# Patient Record
Sex: Male | Born: 1965 | Race: White | Hispanic: No | Marital: Single | Smoking: Former smoker
Health system: Southern US, Community
[De-identification: ages and names within clinical notes are randomized; demographics above are authoritative.]

## PROBLEM LIST (undated history)

## (undated) DIAGNOSIS — K4090 Unilateral inguinal hernia, without obstruction or gangrene, not specified as recurrent: Secondary | ICD-10-CM

---

## 2013-01-01 ENCOUNTER — Emergency Department (HOSPITAL_COMMUNITY): Payer: Self-pay

## 2013-01-01 ENCOUNTER — Encounter (HOSPITAL_COMMUNITY): Payer: Self-pay | Admitting: Emergency Medicine

## 2013-01-01 ENCOUNTER — Emergency Department (HOSPITAL_COMMUNITY)
Admission: EM | Admit: 2013-01-01 | Discharge: 2013-01-01 | Disposition: A | Discharge status: Home / Self Care | Payer: Self-pay | Attending: Emergency Medicine | Admitting: Emergency Medicine

## 2013-01-01 DIAGNOSIS — K4090 Unilateral inguinal hernia, without obstruction or gangrene, not specified as recurrent: Secondary | ICD-10-CM | POA: Insufficient documentation

## 2013-01-01 DIAGNOSIS — R3129 Other microscopic hematuria: Secondary | ICD-10-CM

## 2013-01-01 DIAGNOSIS — R634 Abnormal weight loss: Secondary | ICD-10-CM | POA: Insufficient documentation

## 2013-01-01 DIAGNOSIS — R1013 Epigastric pain: Secondary | ICD-10-CM | POA: Insufficient documentation

## 2013-01-01 DIAGNOSIS — R109 Unspecified abdominal pain: Secondary | ICD-10-CM | POA: Diagnosis present

## 2013-01-01 DIAGNOSIS — R319 Hematuria, unspecified: Secondary | ICD-10-CM | POA: Insufficient documentation

## 2013-01-01 LAB — CBC WITH DIFFERENTIAL/PLATELET
Eosinophils Absolute: 0.1 10*3/uL (ref 0.0–0.7)
Eosinophils Relative: 1 % (ref 0–5)
Lymphs Abs: 1.8 10*3/uL (ref 0.7–4.0)
MCH: 31.2 pg (ref 26.0–34.0)
MCV: 89.3 fL (ref 78.0–100.0)
Monocytes Relative: 12 % (ref 3–12)
Platelets: 247 10*3/uL (ref 150–400)
RBC: 4.75 MIL/uL (ref 4.22–5.81)

## 2013-01-01 LAB — URINALYSIS, ROUTINE W REFLEX MICROSCOPIC
Nitrite: NEGATIVE
Specific Gravity, Urine: 1.026 (ref 1.005–1.030)
Urobilinogen, UA: 0.2 mg/dL (ref 0.0–1.0)

## 2013-01-01 LAB — COMPREHENSIVE METABOLIC PANEL
BUN: 10 mg/dL (ref 6–23)
Calcium: 10 mg/dL (ref 8.4–10.5)
GFR calc Af Amer: >90 mL/min (ref >90)
Glucose, Bld: 102 mg/dL — ABNORMAL HIGH (ref 70–99)
Sodium: 137 mEq/L (ref 135–145)
Total Protein: 7.9 g/dL (ref 6.0–8.3)

## 2013-01-01 LAB — URINE MICROSCOPIC-ADD ON

## 2013-01-01 LAB — LIPASE, BLOOD: Lipase: 33 U/L (ref 11–59)

## 2013-01-01 MED ORDER — IOHEXOL 300 MG/ML  SOLN
50.0000 mL | Freq: Once | INTRAMUSCULAR | Status: AC | PRN
  Administered 2013-01-01: 50 mL via ORAL

## 2013-01-01 MED ORDER — SODIUM CHLORIDE 0.9 % IV BOLUS (SEPSIS)
500.0000 mL | Freq: Once | INTRAVENOUS | Status: AC
  Administered 2013-01-01: 500 mL via INTRAVENOUS

## 2013-01-01 MED ORDER — FAMOTIDINE 20 MG PO TABS: 20.0000 mg | ORAL_TABLET | Freq: Two times a day (BID) | ORAL | Status: DC

## 2013-01-01 MED ORDER — IOHEXOL 300 MG/ML  SOLN
80.0000 mL | Freq: Once | INTRAMUSCULAR | Status: AC | PRN
  Administered 2013-01-01: 80 mL via INTRAVENOUS

## 2013-01-01 NOTE — ED Notes (Addendum)
Pt states that he has had lower abd pain with what he believes is a hernia in his groin.  Denies NVD.  Hernia is reducible.

## 2013-01-01 NOTE — ED Notes (Signed)
EDP at bedside speaking to pt and visitor at this time

## 2013-01-03 ENCOUNTER — Telehealth (HOSPITAL_COMMUNITY): Payer: Self-pay | Admitting: Emergency Medicine

## 2013-01-05 NOTE — ED Provider Notes (Addendum)
History     CSN: [id number]  Arrival date & time 01/01/13  1524   First MD Initiated Contact with Patient 01/01/13 1612      Chief Complaint  Patient presents with  . Abdominal Pain  . Inguinal Hernia    (Consider location/radiation/quality/duration/timing/severity/associated sxs/prior treatment) HPI..... epigastric abdominal pain for several days without vomiting, diarrhea, fever, chills. Patient also reports a chronic right inguinal hernia and 30 pound weight loss over last 2 months. No chronic medical problems. Nothing makes symptoms better or worse. Severity is mild to moderate. No radiation of pain.  History reviewed. No pertinent past medical history.  History reviewed. No pertinent past surgical history.  History reviewed. No pertinent family history.  History  Substance Use Topics  . Smoking status: Never Smoker   . Smokeless tobacco: Not on file  . Alcohol Use: No      Review of Systems  All other systems reviewed and are negative.    Allergies  Aspirin  Home Medications   Current Outpatient Rx  Name  Route  Sig  Dispense  Refill  . naproxen sodium (ANAPROX) 220 MG tablet   Oral   Take 220 mg by mouth as needed (pain).         . famotidine (PEPCID) 20 MG tablet   Oral   Take 1 tablet (20 mg total) by mouth 2 (two) times daily.   30 tablet   0     BP 133/83  Pulse 74  Temp(Src) 98.6 F (37 C) (Oral)  Resp 16  SpO2 99%  Physical Exam  Nursing note and vitals reviewed. Constitutional: He is oriented to person, place, and time. He appears well-developed and well-nourished.  HENT:  Head: Normocephalic and atraumatic.  Eyes: Conjunctivae and EOM are normal. Pupils are equal, round, and reactive to light.  Neck: Normal range of motion. Neck supple.  Cardiovascular: Normal rate, regular rhythm and normal heart sounds.   Pulmonary/Chest: Effort normal and breath sounds normal.  Abdominal:  Minimal epigastric tenderness.   Right inguinal  hernia, not incarcerated  Genitourinary:  No CVAT  Musculoskeletal: Normal range of motion.  Neurological: He is alert and oriented to person, place, and time.  Skin: Skin is warm and dry.  Psychiatric: He has a normal mood and affect.    ED Course  Procedures (including critical care time)  Labs Reviewed  CBC WITH DIFFERENTIAL - Abnormal; Notable for the following:    Monocytes Absolute 1.2 (*)    All other components within normal limits  COMPREHENSIVE METABOLIC PANEL - Abnormal; Notable for the following:    Glucose, Bld 102 (*)    All other components within normal limits  URINALYSIS, ROUTINE W REFLEX MICROSCOPIC - Abnormal; Notable for the following:    APPearance TURBID (*)    Hgb urine dipstick SMALL (*)    Leukocytes, UA TRACE (*)    All other components within normal limits  URINE MICROSCOPIC-ADD ON - Abnormal; Notable for the following:    Squamous Epithelial / LPF FEW (*)    Bacteria, UA FEW (*)    All other components within normal limits  LIPASE, BLOOD   No results found.   1. Abdominal pain   2. Hematuria   3. Weight loss       MDM  Uncertain etiology of patient's weight loss.  Screening blood work, CT abdomen pelvis, chest x-ray all negative.  Blood in urine noted to patient and his fiance.   Discharge meds Pepcid.  Primary care followup  Encouraged.   Patient will also followup on his hematuria.    Phone numbers given for urologist, general surgery, primary care.   This was all discussed with patient and his fiance in great detail        Donnetta Hutching, MD 01/05/13 1610  Donnetta Hutching, MD 01/15/13 [phone]

## 2013-01-15 DIAGNOSIS — R109 Unspecified abdominal pain: Secondary | ICD-10-CM | POA: Diagnosis present

## 2013-07-01 ENCOUNTER — Emergency Department (HOSPITAL_COMMUNITY)
Admission: EM | Admit: 2013-07-01 | Discharge: 2013-07-01 | Disposition: A | Discharge status: Home / Self Care | Payer: No Typology Code available for payment source | Attending: Emergency Medicine | Admitting: Emergency Medicine

## 2013-07-01 ENCOUNTER — Encounter (HOSPITAL_COMMUNITY): Payer: Self-pay | Admitting: Emergency Medicine

## 2013-07-01 DIAGNOSIS — K4090 Unilateral inguinal hernia, without obstruction or gangrene, not specified as recurrent: Secondary | ICD-10-CM

## 2013-07-01 DIAGNOSIS — N5089 Other specified disorders of the male genital organs: Secondary | ICD-10-CM | POA: Insufficient documentation

## 2013-07-01 DIAGNOSIS — R339 Retention of urine, unspecified: Secondary | ICD-10-CM | POA: Insufficient documentation

## 2013-07-01 HISTORY — DX: Unilateral inguinal hernia, without obstruction or gangrene, not specified as recurrent: K40.90

## 2013-07-01 MED ORDER — HYDROMORPHONE HCL PF 1 MG/ML IJ SOLN
1.0000 mg | Freq: Once | INTRAMUSCULAR | Status: AC
  Administered 2013-07-01: 1 mg via INTRAMUSCULAR
  Filled 2013-07-01: qty 1

## 2013-07-01 NOTE — ED Notes (Addendum)
Pt reports he has a R sided inguinal hernia since June of this year, that has moved down and now feels like a baseball sized mass to his testicle for the past 2 weeks. Pt states that he has had difficulty urinating since then as well.

## 2013-07-01 NOTE — ED Provider Notes (Signed)
CSN: [id number]     Arrival date & time 07/01/13  2038 History   First MD Initiated Contact with Patient 07/01/13 2122     Chief Complaint  Patient presents with  . Inguinal Hernia  . Urinary Retention   (Consider location/radiation/quality/duration/timing/severity/associated sxs/prior Treatment) HPI Comments: 47 yo male with a right sided inguinal mass.  Associated with constant, moderate, dull, aching pain.    Patient is a 47 y.o. male presenting with male genitourinary complaint.  Male GU Problem Presenting symptoms comment:  Scrotal swelling Context: spontaneously   Relieved by: laying flat. Exacerbated by: standing up. Ineffective treatments:  None tried Associated symptoms: abdominal pain (occasional, not now), groin pain and scrotal swelling   Associated symptoms: no fever     Past Medical History  Diagnosis Date  . Inguinal hernia    History reviewed. No pertinent past surgical history. History reviewed. No pertinent family history. History  Substance Use Topics  . Smoking status: Never Smoker   . Smokeless tobacco: Never Used  . Alcohol Use: No    Review of Systems  Constitutional: Negative for fever.  Gastrointestinal: Positive for abdominal pain (occasional, not now).  Genitourinary: Positive for scrotal swelling.  All other systems reviewed and are negative.    Allergies  Aspirin  Home Medications   Current Outpatient Rx  Name  Route  Sig  Dispense  Refill  . calcium carbonate (TUMS - DOSED IN MG ELEMENTAL CALCIUM) 500 MG chewable tablet   Oral   Chew 1 tablet by mouth daily as needed for indigestion or heartburn.         . naproxen sodium (ANAPROX) 220 MG tablet   Oral   Take 220 mg by mouth as needed (pain).         Marland Kitchen PE-DM-APAP & Doxylamin-DM-APAP (VICKS DAYQUIL/NYQUIL CLD & FLU) (LIQUID) MISC   Oral   Take 30 mLs by mouth at bedtime as needed (for sleep).          BP 145/93  Pulse 71  Temp(Src) 98.9 F (37.2 C) (Oral)  Resp 14   SpO2 97% Physical Exam  Nursing note and vitals reviewed. Constitutional: He is oriented to person, place, and time. He appears well-developed and well-nourished. No distress.  HENT:  Head: Normocephalic and atraumatic.  Eyes: Conjunctivae are normal. No scleral icterus.  Neck: Neck supple.  Cardiovascular: Normal rate and intact distal pulses.   Pulmonary/Chest: Effort normal. No stridor. No respiratory distress.  Abdominal: Normal appearance. He exhibits no distension. There is no tenderness. There is no rebound and no guarding. A hernia is present. Hernia confirmed positive in the right inguinal area.  Genitourinary: Right testis shows no tenderness. Left testis shows no tenderness.  Neurological: He is alert and oriented to person, place, and time.  Skin: Skin is warm and dry. No rash noted.  Psychiatric: He has a normal mood and affect. His behavior is normal.    ED Course  Hernia reduction Date/Time: 07/02/2013 12:37 AM Performed by: Blake Divine DAVID Authorized by: Blake Divine DAVID Consent: Verbal consent obtained. Risks and benefits: risks, benefits and alternatives were discussed Comments: Given 1mg  IM dilaudid.  Firm, constant, manual pressure was applied to his right inguinal mass which resulted in complete reduction of contents into abdomen.     (including critical care time) Labs Review Labs Reviewed - No data to display Imaging Review No results found.  EKG Interpretation   None       MDM   1. Right inguinal  hernia    47 yo male with a right sided inguinal hernia.  Has been getting worse over past 6 months.  After 1 mg IM dilaudid, I was able to fully reduce hernia.  Advised surgical followup.      Candyce Churn, MD 07/02/13 [phone]

## 2013-07-02 NOTE — Progress Notes (Signed)
   CARE MANAGEMENT ED NOTE 07/02/2013  Patient:  [name], [name]   Account Number:  [id number]  Date Initiated:  07/02/2013  Documentation initiated by:  Edd Arbour  Subjective/Objective Assessment:   47 yr old male listed with gccn discount (found to have [facility] charity program at 100% not orange card P4[facility]/GCCN) Pt referred by EDP to Dr Gordy Savers surgeon but called & informed that [facility] does not assist GCCN/charity pts Request another     Subjective/Objective Assessment Detail:   recommendation for a surgeon for hernia issues return number [phone]  dx with adult inguinal hernia  Carmen recommended pt to call [facility] surgeons/financial counselor to apply for [facility] charity program [phone]     Action/Plan:   Cm received pt's fiance' s voice message, reviewed EPIC notes labs, d/c instructions and avs Spoke with Dr Genene Churn, EDP who referred CM to Adult care center for possible gccn surgeon list Spoke with Porfirio Mylar at [phone] who assisted CM at 1651   Action/Plan Detail:   CM called back and spoke with fiance per pt permission and provided [facility] surgeon/financial counselor contact information provided by Porfirio Mylar to assist with seeing pt # [phone] Informed fiance pt is to speak with the charity staff   Anticipated DC Date:  07/01/2013     Status Recommendation to Physician:   Result of Recommendation:    Other ED Services  Consult Working Plan   In-house referral  Artist   DC Planning Services  Other  Outpatient Services - Pt will follow up    Choice offered to / List presented to:            Status of service:  Completed, signed off  ED Comments:   ED Comments Detail:

## 2013-07-03 ENCOUNTER — Telehealth (INDEPENDENT_AMBULATORY_CARE_PROVIDER_SITE_OTHER): Payer: Self-pay

## 2013-07-03 NOTE — Telephone Encounter (Signed)
Pt's girlfriend called back. I gave her the appt date with Dr Magnus Ivan and she said she called yesterday to our office to speak to someone about the orange card they have for insurance. They were told yesterday we do not participate with the orange card. I did check with the front desk about seeing this pt with the orange card again since there was no note from yesterday in the system and they advised me that we have met our quota for the year with the orange card. The pt would have to pay cash if he wants to be seen in this group. The pt's girlfriend told me to cancel the appt and she was checking with [facility] to see if they accept the orange card.

## 2013-07-03 NOTE — Telephone Encounter (Signed)
LMOM for pt to call me back. Dr Michaell Cowing left me a message in epic to get this pt scheduled with anyone in the group for a eval. Of [facility]. Dr Michaell Cowing received a call while he was on call about this pt having a hernia but Dr Michaell Cowing said the pt just needed an appt to be seen at [facility]. I have made the pt an appt with Dr Magnus Ivan for 07/11/13 arrive at 2:50/3:20.

## 2013-07-03 NOTE — Progress Notes (Signed)
07/03/13 1622 [facility] ED CM received a referral from [facility] ED Unit secretary stating a male called for the pt inquiring about a surgeon for the pt and requests a call back to [phone]  CM returned the call and spoke with the pt who gave the telephone to a male who inquired about other surgeons for pt CM informed male that the resources for [facility]  provided on 07/03/13 and on 07/01/13 Dr Karie Soda  are the only resources Cm could provide Pt still has not attempted to connect with a pcp

## 2013-07-11 ENCOUNTER — Ambulatory Visit (INDEPENDENT_AMBULATORY_CARE_PROVIDER_SITE_OTHER): Payer: Self-pay | Admitting: Surgery

## 2013-10-17 ENCOUNTER — Ambulatory Visit (INDEPENDENT_AMBULATORY_CARE_PROVIDER_SITE_OTHER): Payer: No Typology Code available for payment source | Admitting: Family Medicine

## 2013-10-17 ENCOUNTER — Encounter: Payer: Self-pay | Admitting: Family Medicine

## 2013-10-17 VITALS — BP 157/77 | HR 84 | Temp 98.1°F | Ht 71.0 in | Wt 189.3 lb

## 2013-10-17 DIAGNOSIS — K4090 Unilateral inguinal hernia, without obstruction or gangrene, not specified as recurrent: Secondary | ICD-10-CM

## 2013-10-17 DIAGNOSIS — Z720 Tobacco use: Secondary | ICD-10-CM

## 2013-10-17 DIAGNOSIS — F17200 Nicotine dependence, unspecified, uncomplicated: Secondary | ICD-10-CM

## 2013-10-17 NOTE — Progress Notes (Signed)
    Subjective:     Patient ID: Craig BougieGeorge Cantu, male   DOB: [birth year], 48 y.o.   MRN: [id number]  HPI [name] is here to est. Care and for f/u for his inguinal hernia.  His hernia started in June 2014.  This has been ongoing. He has had multiple ED visits for this problem. It has never been incarcerated and has been reducible.  The hernia has gotten much larger since its beginning and it starting to fill out his scrotum.  It has been causing pain due to this.  He denise any fevers, vomiting, or changes in bowel movements. Her has no changes on urination.  He has pain upon standing up straight.  The pain feels like it is tugging down his abdomen and feels electric.  The pain occurs a couple of times per week. The pain is relieved when he lies flat.  He had not trauma or strain upon the initiation of the pain.  He denies any warmth or erythema over his scrotum.   He is here to establish care. He has had increased stress since his hernia has started. He has been out of work and does not have insurance.  He started smoking again due to his stress.  He had previously quit smoking years ago.     Current Outpatient Prescriptions on File Prior to Visit  Medication Sig Dispense Refill  . calcium carbonate (TUMS - DOSED IN MG ELEMENTAL CALCIUM) 500 MG chewable tablet Chew 1 tablet by mouth daily as needed for indigestion or heartburn.      . naproxen sodium (ANAPROX) 220 MG tablet Take 220 mg by mouth as needed (pain).      Marland Kitchen. PE-DM-APAP & Doxylamin-DM-APAP (VICKS DAYQUIL/NYQUIL CLD & FLU) (LIQUID) MISC Take 30 mLs by mouth at bedtime as needed (for sleep).       No current facility-administered medications on file prior to visit.   SHx: started smoking again due to stress.   Health Maintenance:  Had a colonoscopy in 1997 because he had blood in his stool. It resulted to be normal.    Review of Systems See HPI     Objective:   Physical Exam BP 157/77  Pulse 84  Temp(Src) 98.1 F (36.7 C)  (Oral)  Ht 5\' 11"  (1.803 m)  Wt 189 lb 4.8 oz (85.866 kg)  BMI 26.41 kg/m2 Gen: NAD, alert, cooperative with exam, well-appearing, Caucasian male  HEENT: NCAT, clear conjunctiva,  CV: RRR, good S1/S2, no murmur, no edema, capillary refill brisk  Resp: CTABL, no wheezes, non-labored Abd: SNTND, BS present, no guarding or organomegaly GU: enlarged scrotum, no warm or erythema, attempted to reduce but too painful  Skin: no rashes, normal turgor  Neuro: no gross deficits.  Psych: good insight, alert and oriented     Assessment:         Plan:

## 2013-10-17 NOTE — Patient Instructions (Signed)
Thank you for coming in,   I will put in the referral to Surgery. Our office will call you when we have found someone to take the case.   You may use a hernia truss or a jock strap to help hold up the hernia. Holding up the hernia may reduce the amount of pain that you feel when you walk around. If you have any fevers, persistent vomiting or feel the hernia get really hard then you need to go to the ER immediately.   Please try to stop smoking. Smoking interferes with you healing once you have the surgery.  The patches cost about as much as a pack of cigarettes. At [employer] they cost $25.    Please feel free to call with any questions or concerns at any time, at [phone]. --Dr. Jordan Likes  Inguinal Hernia, Adult Muscles help keep everything in the body in its proper place. But if a weak spot in the muscles develops, something can poke through. That is called a hernia. When this happens in the lower part of the belly (abdomen), it is called an inguinal hernia. (It takes its name from a part of the body in this region called the inguinal canal.) A weak spot in the wall of muscles lets some fat or part of the small intestine bulge through. An inguinal hernia can develop at any age. Men get them more often than women. CAUSES  In adults, an inguinal hernia develops over time.  It can be triggered by:  Suddenly straining the muscles of the lower abdomen.  Lifting heavy objects.  Straining to have a bowel movement. Difficult bowel movements (constipation) can lead to this.  Constant coughing. This may be caused by smoking or lung disease.  Being overweight.  Being pregnant.  Working at a job that requires long periods of standing or heavy lifting.  Having had an inguinal hernia before. One type can be an emergency situation. It is called a strangulated inguinal hernia. It develops if part of the small intestine slips through the weak spot and cannot get back into the abdomen. The blood supply  can be cut off. If that happens, part of the intestine may die. This situation requires emergency surgery. SYMPTOMS  Often, a small inguinal hernia has no symptoms. It is found when a healthcare provider does a physical exam. Larger hernias usually have symptoms.   In adults, symptoms may include:  A lump in the groin. This is easier to see when the person is standing. It might disappear when lying down.  In men, a lump in the scrotum.  Pain or burning in the groin. This occurs especially when lifting, straining or coughing.  A dull ache or feeling of pressure in the groin.  Signs of a strangulated hernia can include:  A bulge in the groin that becomes very painful and tender to the touch.  A bulge that turns red or purple.  Fever, nausea and vomiting.  Inability to have a bowel movement or to pass gas. DIAGNOSIS  To decide if you have an inguinal hernia, a healthcare provider will probably do a physical examination.  This will include asking questions about any symptoms you have noticed.  The healthcare provider might feel the groin area and ask you to cough. If an inguinal hernia is felt, the healthcare provider may try to slide it back into the abdomen.  Usually no other tests are needed. TREATMENT  Treatments can vary. The size of the hernia makes a difference.  Options include:  Watchful waiting. This is often suggested if the hernia is small and you have had no symptoms.  No medical procedure will be done unless symptoms develop.  You will need to watch closely for symptoms. If any occur, contact your healthcare provider right away.  Surgery. This is used if the hernia is larger or you have symptoms.  Open surgery. This is usually an outpatient procedure (you will not stay overnight in a hospital). An cut (incision) is made through the skin in the groin. The hernia is put back inside the abdomen. The weak area in the muscles is then repaired by herniorrhaphy or  hernioplasty. Herniorrhaphy: in this type of surgery, the weak muscles are sewn back together. Hernioplasty: a patch or mesh is used to close the weak area in the abdominal wall.  Laparoscopy. In this procedure, a surgeon makes small incisions. A thin tube with a tiny video camera (called a laparoscope) is put into the abdomen. The surgeon repairs the hernia with mesh by looking with the video camera and using two long instruments. HOME CARE INSTRUCTIONS   After surgery to repair an inguinal hernia:  You will need to take pain medicine prescribed by your healthcare provider. Follow all directions carefully.  You will need to take care of the wound from the incision.  Your activity will be restricted for awhile. This will probably include no heavy lifting for several weeks. You also should not do anything too active for a few weeks. When you can return to work will depend on the type of job that you have.  During "watchful waiting" periods, you should:  Maintain a healthy weight.  Eat a diet high in fiber (fruits, vegetables and whole grains).  Drink plenty of fluids to avoid constipation. This means drinking enough water and other liquids to keep your urine clear or pale yellow.  Do not lift heavy objects.  Do not stand for long periods of time.  Quit smoking. This should keep you from developing a frequent cough. SEEK MEDICAL CARE IF:   A bulge develops in your groin area.  You feel pain, a burning sensation or pressure in the groin. This might be worse if you are lifting or straining.  You develop a fever of more than 100.5 F (38.1 C). SEEK IMMEDIATE MEDICAL CARE IF:   Pain in the groin increases suddenly.  A bulge in the groin gets bigger suddenly and does not go down.  For men, there is sudden pain in the scrotum. Or, the size of the scrotum increases.  A bulge in the groin area becomes red or purple and is painful to touch.  You have nausea or vomiting that does not  go away.  You feel your heart beating much faster than normal.  You cannot have a bowel movement or pass gas.  You develop a fever of more than 102.0 F (38.9 C). Document Released: 12/04/2008 Document Revised: 10/10/2011 Document Reviewed: 12/04/2008 [facility] Patient Information 2014 [place], [place].

## 2013-10-21 ENCOUNTER — Encounter: Payer: Self-pay | Admitting: Family Medicine

## 2013-10-21 DIAGNOSIS — Z720 Tobacco use: Secondary | ICD-10-CM | POA: Insufficient documentation

## 2013-10-21 DIAGNOSIS — K4090 Unilateral inguinal hernia, without obstruction or gangrene, not specified as recurrent: Secondary | ICD-10-CM | POA: Insufficient documentation

## 2013-10-21 NOTE — Assessment & Plan Note (Addendum)
Hernia is not incarcerated. Not able to be reproducible due to pain. [place]  If no surgeon takes his case [facility]. - advised to use either a jock strap for support or truss support  - Referral to general surgery  - discussed with Dr. Deirdre Priesthambliss.

## 2013-10-21 NOTE — Assessment & Plan Note (Signed)
Started smoking again. He states he has quit before with patches. Advised that a box of patches is the same price of carton of cigarettes. He was receptive. It he were to have the surgery it would decrease his wound healing if continue to smoke  - [facility] has box of patches for $25.  - He appears receptive to quitting and has quit before for several years.

## 2015-01-10 IMAGING — CT CT ABD-PELV W/ CM
1 of 3 series · 14 of 32 positions shown, 19 images · IV contrast (OMNIPAQUE 300)
Comparison: None.

CLINICAL DATA: Right inguinal hernia.  Epigastric pain.  40 pounds
weight loss.

CT ABDOMEN AND PELVIS WITH CONTRAST
TECHNIQUE: Multidetector CT imaging of the abdomen and pelvis was
performed following the standard protocol during bolus
administration of intravenous contrast.
Contrast: 50mL OMNIPAQUE IOHEXOL 300 MG/ML  SOLN, 80mL OMNIPAQUE
IOHEXOL 300 MG/ML  SOLN

[Series 2: abd/pel with · axial · 0.79mm/px · z∈[+992,+1382]mm · 14 of 90 slices shown, 19 images]
[im 6/90  soft-tissue]
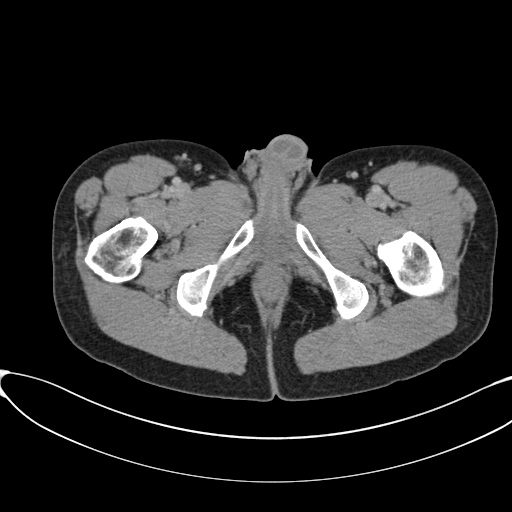
[im 6/90  bone]
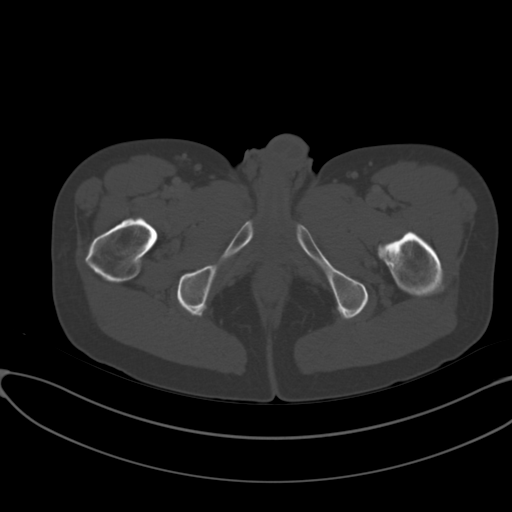
[im 11/90  soft-tissue]
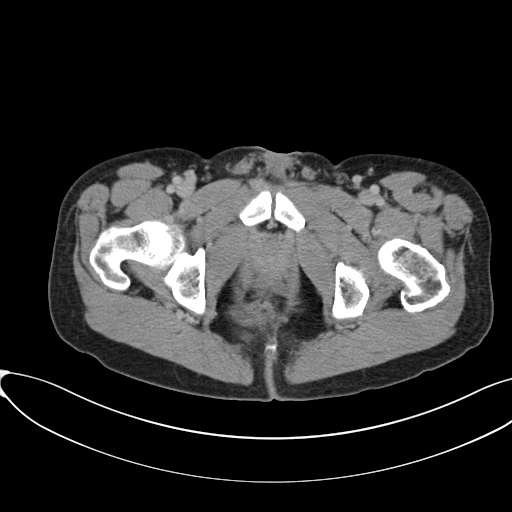
[im 21/90  soft-tissue]
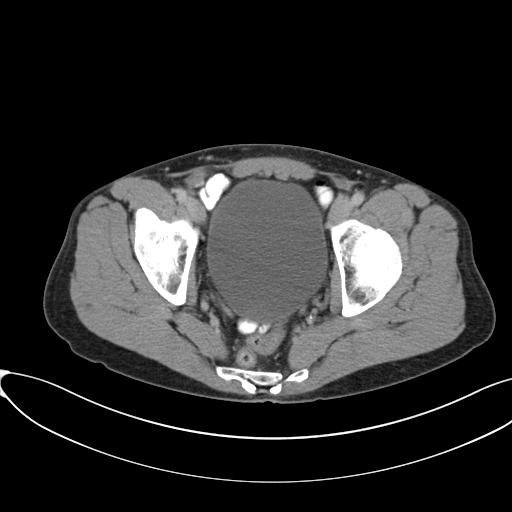
[im 27/90  soft-tissue]
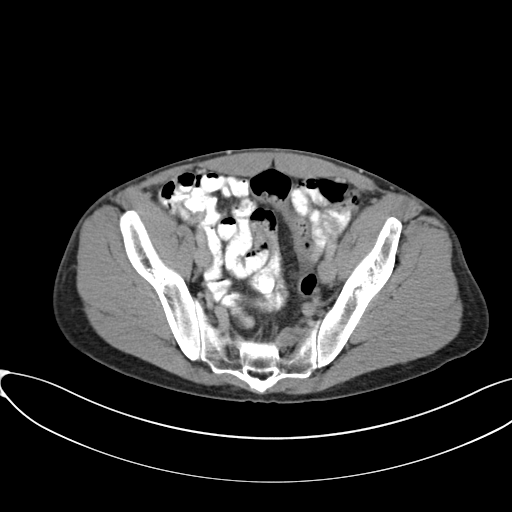
[im 32/90  soft-tissue]
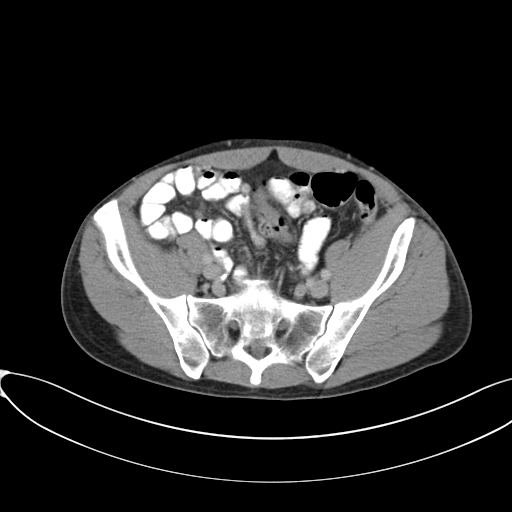
[im 37/90  soft-tissue]
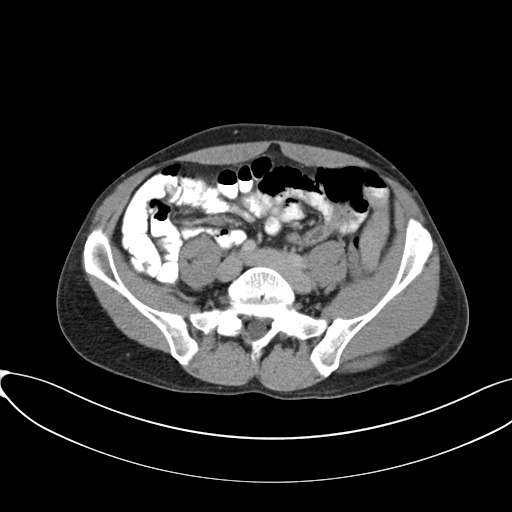
[im 48/90  soft-tissue]
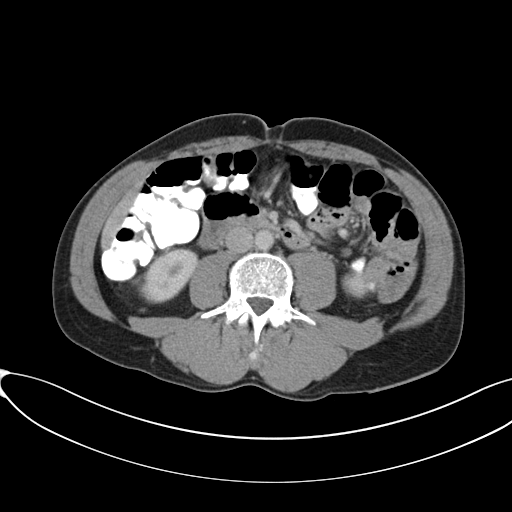
[im 53/90  soft-tissue]
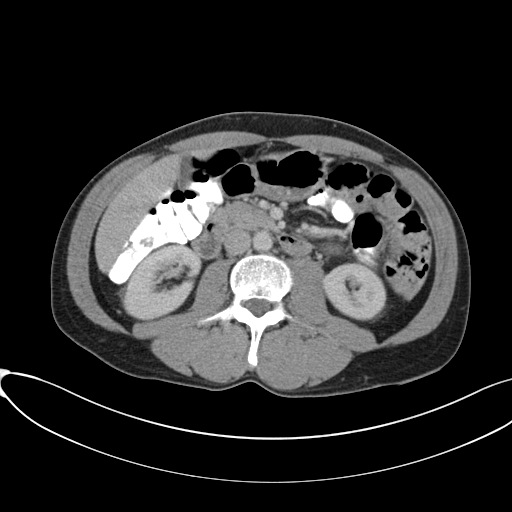
[im 58/90  soft-tissue]
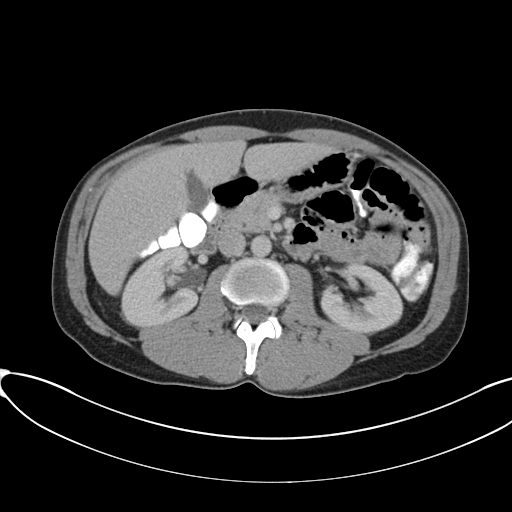
[im 58/90  bone]
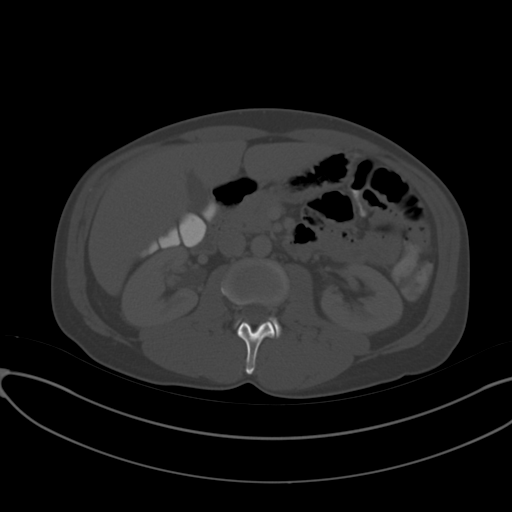
[im 63/90  soft-tissue]
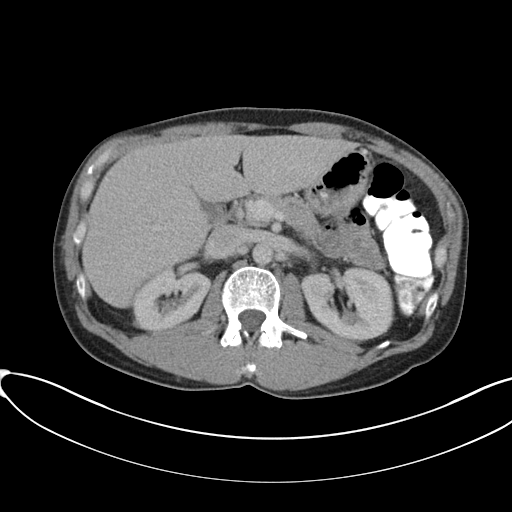
[im 69/90  soft-tissue]
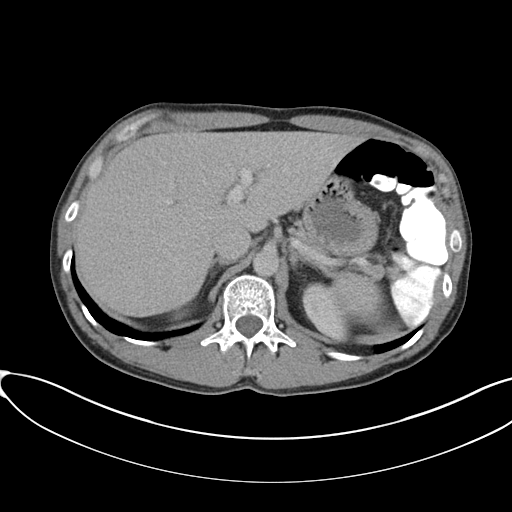
[im 69/90  lung]
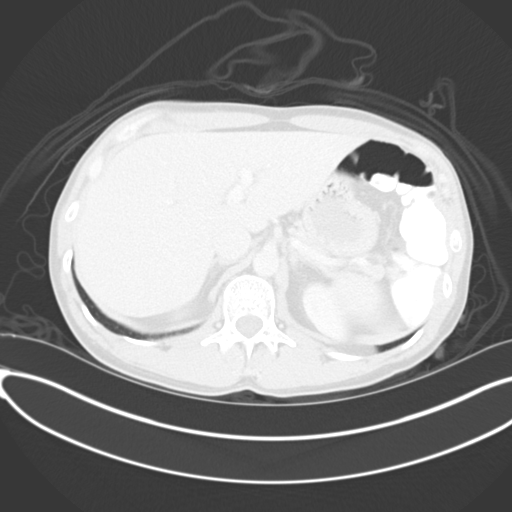
[im 74/90  lung]
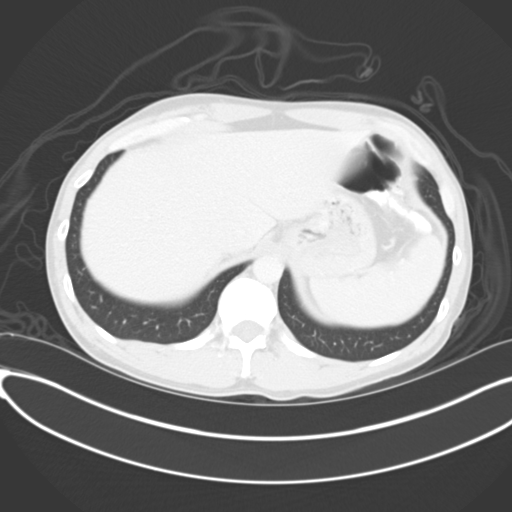
[im 79/90  soft-tissue]
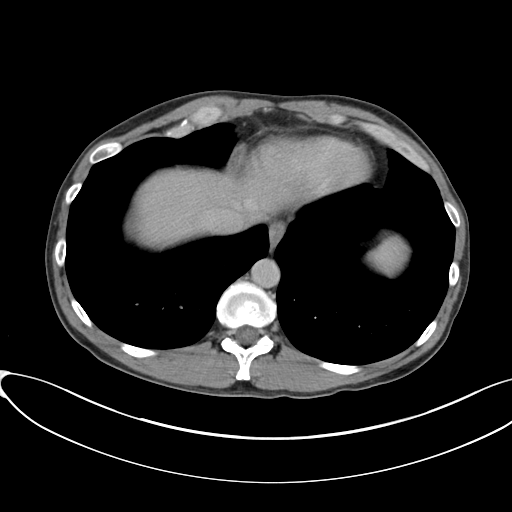
[im 79/90  lung]
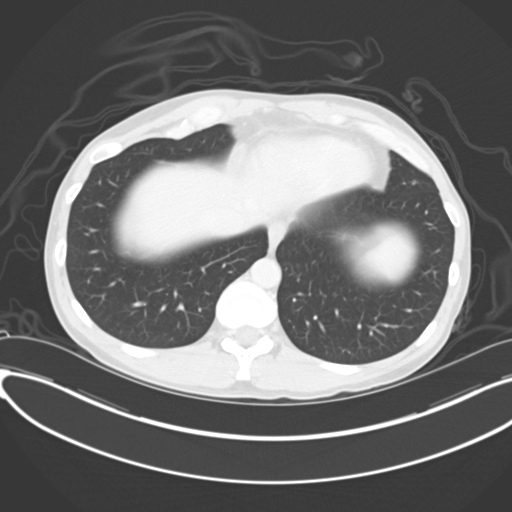
[im 84/90  soft-tissue]
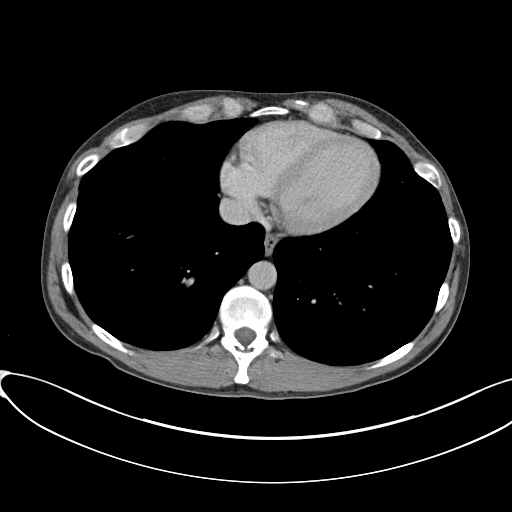
[im 84/90  lung]
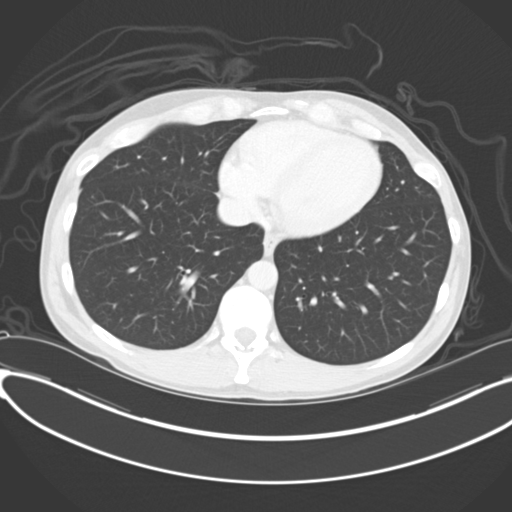

[14 of 32 positions shown; findings below may reference images not displayed]

FINDINGS: Lung Bases: Minimal lingular atelectasis or scar.  Scout
images appear within normal limits.

Liver:  No mass lesion.  Tiny low density lesion adjacent the
gallbladder fossa may represent a tiny cyst or hemangioma but is
too small to characterize.  No intrahepatic biliary ductal
dilation.

Spleen:  Normal.

Gallbladder:  Normal.

Common bile duct:  Normal.

Pancreas:  Normal.

Adrenal glands:  Normal.

Kidneys:  Normal enhancement.  Normal delayed excretion of
contrast.  Both ureters appear within normal limits.

Stomach:  Normal.

Small bowel:  Normal.  No mesenteric or small bowel mass.  No
inflammatory changes.  No mesenteric adenopathy.

Colon:   Normal appendix.  Ascending colon appears normal.
Transverse and descending colon are within normal limits.

Pelvic Genitourinary:  Urinary bladder appears normal.  No free
fluid.  No pelvic adenopathy.

Bones:  Small intraosseous lipoma in the proximal left quadriceps.
No aggressive osseous lesions.  Schmorl's node in the lower
thoracic spine.  L5-S1 degenerative disc disease.  Congenitally
short pedicles in the lower lumbar spine.

Vasculature: Normal.

Body Wall: Normal. Negative for hernia.
IMPRESSION: Normal CT abdomen and pelvis.
